# Patient Record
Sex: Male | Born: 1970 | Race: White | Hispanic: No | Marital: Single | Smoking: Never smoker
Health system: Southern US, Community
[De-identification: ages and names within clinical notes are randomized; demographics above are authoritative.]

## PROBLEM LIST (undated history)

## (undated) DIAGNOSIS — E119 Type 2 diabetes mellitus without complications: Secondary | ICD-10-CM

## (undated) HISTORY — PX: KIDNEY TRANSPLANT: SHX239

## (undated) HISTORY — PX: COMBINED KIDNEY-PANCREAS TRANSPLANT: SHX1382

---

## 2018-09-28 ENCOUNTER — Emergency Department (HOSPITAL_BASED_OUTPATIENT_CLINIC_OR_DEPARTMENT_OTHER): Payer: Medicare Other

## 2018-09-28 ENCOUNTER — Emergency Department (HOSPITAL_BASED_OUTPATIENT_CLINIC_OR_DEPARTMENT_OTHER)
Admission: EM | Admit: 2018-09-28 | Discharge: 2018-09-28 | Disposition: A | Discharge status: Home / Self Care | Payer: Medicare Other | Attending: Emergency Medicine | Admitting: Emergency Medicine

## 2018-09-28 ENCOUNTER — Encounter (HOSPITAL_BASED_OUTPATIENT_CLINIC_OR_DEPARTMENT_OTHER): Payer: Self-pay | Admitting: *Deleted

## 2018-09-28 ENCOUNTER — Other Ambulatory Visit: Payer: Self-pay

## 2018-09-28 DIAGNOSIS — Z9483 Pancreas transplant status: Secondary | ICD-10-CM | POA: Insufficient documentation

## 2018-09-28 DIAGNOSIS — E119 Type 2 diabetes mellitus without complications: Secondary | ICD-10-CM | POA: Insufficient documentation

## 2018-09-28 DIAGNOSIS — Z79899 Other long term (current) drug therapy: Secondary | ICD-10-CM | POA: Insufficient documentation

## 2018-09-28 DIAGNOSIS — R2242 Localized swelling, mass and lump, left lower limb: Secondary | ICD-10-CM | POA: Diagnosis present

## 2018-09-28 DIAGNOSIS — Z7982 Long term (current) use of aspirin: Secondary | ICD-10-CM | POA: Insufficient documentation

## 2018-09-28 DIAGNOSIS — L539 Erythematous condition, unspecified: Secondary | ICD-10-CM | POA: Insufficient documentation

## 2018-09-28 DIAGNOSIS — Z89511 Acquired absence of right leg below knee: Secondary | ICD-10-CM | POA: Insufficient documentation

## 2018-09-28 DIAGNOSIS — R69 Illness, unspecified: Secondary | ICD-10-CM

## 2018-09-28 DIAGNOSIS — L03116 Cellulitis of left lower limb: Secondary | ICD-10-CM

## 2018-09-28 DIAGNOSIS — Z940 Kidney transplant status: Secondary | ICD-10-CM | POA: Diagnosis not present

## 2018-09-28 HISTORY — DX: Type 2 diabetes mellitus without complications: E11.9

## 2018-09-28 LAB — CBC WITH DIFFERENTIAL/PLATELET
Abs Immature Granulocytes: 0.02 10*3/uL (ref 0.00–0.07)
Basophils Absolute: 0 10*3/uL (ref 0.0–0.1)
Basophils Relative: 0 %
Eosinophils Absolute: 0.1 10*3/uL (ref 0.0–0.5)
Eosinophils Relative: 1 %
HCT: 46.6 % (ref 39.0–52.0)
Hemoglobin: 15.3 g/dL (ref 13.0–17.0)
Immature Granulocytes: 0 %
Lymphocytes Relative: 21 %
Lymphs Abs: 1.5 10*3/uL (ref 0.7–4.0)
MCH: 32.6 pg (ref 26.0–34.0)
MCHC: 32.8 g/dL (ref 30.0–36.0)
MCV: 99.4 fL (ref 80.0–100.0)
Monocytes Absolute: 1.1 10*3/uL — ABNORMAL HIGH (ref 0.1–1.0)
Monocytes Relative: 15 %
Neutro Abs: 4.6 10*3/uL (ref 1.7–7.7)
Neutrophils Relative %: 63 %
Platelets: 182 10*3/uL (ref 150–400)
RBC: 4.69 MIL/uL (ref 4.22–5.81)
RDW: 12.8 % (ref 11.5–15.5)
WBC: 7.3 10*3/uL (ref 4.0–10.5)
nRBC: 0 % (ref 0.0–0.2)

## 2018-09-28 LAB — COMPREHENSIVE METABOLIC PANEL
ALT: 17 U/L (ref 0–44)
AST: 21 U/L (ref 15–41)
Albumin: 4.1 g/dL (ref 3.5–5.0)
Alkaline Phosphatase: 79 U/L (ref 38–126)
Anion gap: 10 (ref 5–15)
BUN: 10 mg/dL (ref 6–20)
CO2: 24 mmol/L (ref 22–32)
Calcium: 9.3 mg/dL (ref 8.9–10.3)
Chloride: 104 mmol/L (ref 98–111)
Creatinine, Ser: 0.97 mg/dL (ref 0.61–1.24)
GFR calc Af Amer: >60 mL/min (ref >60)
GFR calc non Af Amer: >60 mL/min (ref >60)
Glucose, Bld: 89 mg/dL (ref 70–99)
Potassium: 4.2 mmol/L (ref 3.5–5.1)
Sodium: 138 mmol/L (ref 135–145)
Total Bilirubin: 1.2 mg/dL (ref 0.3–1.2)
Total Protein: 7.5 g/dL (ref 6.5–8.1)

## 2018-09-28 LAB — LACTIC ACID, PLASMA: Lactic Acid, Venous: 1.5 mmol/L (ref 0.5–1.9)

## 2018-09-28 MED ORDER — SODIUM CHLORIDE 0.9 % IV SOLN
1.0000 g | Freq: Once | INTRAVENOUS | Status: AC
  Administered 2018-09-28: 1 g via INTRAVENOUS
  Filled 2018-09-28: qty 10

## 2018-09-28 MED ORDER — CEPHALEXIN 500 MG PO CAPS: 1000.0000 mg | ORAL_CAPSULE | Freq: Two times a day (BID) | ORAL | 0 refills | Status: AC

## 2018-09-28 NOTE — ED Provider Notes (Signed)
[place] EMERGENCY DEPARTMENT Provider Note   CSN: [id number] Arrival date & time: 09/28/18  1844     History   Chief Complaint Chief Complaint  Patient presents with  . Leg Pain    HPI Mark Colon is a 48 y.o. male.     HPI Patient reports he just noticed a little bit of increased redness and swelling to his lower leg yesterday.  He reports there is always a "touch of redness" to the left lower leg.  (He has a amputation of the right lower extremity.  Distant from a complication of foot fracture).  Patient wears work boots.  He reports that today he noticed that the leg was actually sore and it was much more red.  He sent a picture to some of his friends were medics and they advised him to get it checked at the emergency department.  He reports he has no other symptoms.  No fevers no chills no malaise.  No chest pain no shortness of breath.  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  He has done well with this for years. Past Medical History:  Diagnosis Date  . Diabetes mellitus without complication ([place])     There are no active problems to display for this patient.   Past Surgical History:  Procedure Laterality Date  . COMBINED KIDNEY-PANCREAS TRANSPLANT    . KIDNEY TRANSPLANT          Home Medications    Prior to Admission medications   Medication Sig Start Date End Date Taking? Authorizing Provider  aspirin EC 81 MG tablet Take by mouth.   Yes [provider]  metoprolol tartrate (LOPRESSOR) 25 MG tablet metoprolol tartrate 25 mg tablet  Take 1 tablet twice a day by oral route. 11/15/11  Yes [provider]  mycophenolate (CELLCEPT) 250 MG capsule CellCept 250 mg capsule  Take 6 capsules twice a day by oral route. 04/13/18  Yes [provider]  ondansetron (ZOFRAN) 8 MG tablet Zofran 8 mg tablet  Take 1/2 - 1 tablet(s) EVERY 8 HOURS by oral route as needed   Yes [provider]   Oxycodone HCl 10 MG TABS oxycodone 10 mg tablet  Take 1/2 - 1 tablet(s) EVERY 4 HOURS by oral route as needed   Yes [provider]  promethazine (PHENERGAN) 25 MG tablet promethazine 25 mg tablet  Take 1 tablet every 8 hours by oral route as needed.   Yes [provider]  tacrolimus (PROGRAF) 0.5 MG capsule TAKE 1 CAPSULE BY MOUTH TWO TIMES DAILY 08/09/18  Yes [provider]  tacrolimus (PROGRAF) 1 MG capsule Prograf 1 mg capsule  Take 2 mg twice a day by oral route. 08/09/18  Yes [provider]  triamcinolone acetonide (TRIESENCE) 40 MG/ML SUSP triamcinolone acetonide 40 mg/mL suspension for injection  Administer 60mg  once   Yes [provider]  cephALEXin (KEFLEX) 500 MG capsule Take 2 capsules (1,000 mg total) by mouth 2 (two) times daily. 09/28/18   Charlesetta Shanks, MD    Family History No family history on file.  Social History Social History   Tobacco Use  . Smoking status: Never Smoker  . Smokeless tobacco: Never Used  Substance Use Topics  . Alcohol use: Never    Frequency: Never  . Drug use: Never     Allergies   Codeine and Metoclopramide   Review of Systems Review of Systems 10 Systems reviewed and are negative for acute change  except as noted in the HPI.   Physical Exam Updated Vital Signs BP 129/88 (BP Location: Right Arm)   Pulse 72   Temp 98.7 F (37.1 C) (Oral)   Resp 18   Ht 5\' 7"  (1.702 m)   Wt 97.7 kg   SpO2 99%   BMI 33.73 kg/m   Physical Exam Constitutional:      Comments: Patient is alert and appropriate.  Nontoxic.  No distress.  Cardiovascular:     Rate and Rhythm: Normal rate and regular rhythm.  Pulmonary:     Effort: Pulmonary effort is normal.     Breath sounds: Normal breath sounds.  Abdominal:     General: There is no distension.     Palpations: Abdomen is soft.     Tenderness: There is no abdominal tenderness. There is no guarding.  Musculoskeletal:     Comments: Left lower  extremity has erythema that is circumferential from above the ankle to about the midpoint of the lower leg.  About 1+ edema.  The foot and ankle are spared of erythema.  No streaking.  Her Salas pedis pulses 2+ and strong.  The foot is warm and dry.  There are no wounds on the foot or between the toes.  No effusion of the knee.  Patient is wearing a prosthesis on his right lower extremity.  Skin:    General: Skin is warm and dry.  Neurological:     General: No focal deficit present.     Mental Status: He is oriented to person, place, and time.     Coordination: Coordination normal.  Psychiatric:        Mood and Affect: Mood normal.      ED Treatments / Results  Labs (all labs ordered are listed, but only abnormal results are displayed) Labs Reviewed  CBC WITH DIFFERENTIAL/PLATELET - Abnormal; Notable for the following components:      Result Value   Monocytes Absolute 1.1 (*)    All other components within normal limits  CULTURE, BLOOD (ROUTINE X 2)  CULTURE, BLOOD (ROUTINE X 2)  COMPREHENSIVE METABOLIC PANEL  LACTIC ACID, PLASMA  LACTIC ACID, PLASMA    EKG None  Radiology [place] Venous Img Lower Unilateral Left  Result Date: 09/28/2018 CLINICAL DATA:  48 year old male with left lower extremity redness and swelling and pain x2 days. EXAM: Left LOWER EXTREMITY VENOUS DOPPLER ULTRASOUND TECHNIQUE: Gray-scale sonography with graded compression, as well as color Doppler and duplex ultrasound were performed to evaluate the lower extremity deep venous systems from the level of the common femoral vein and including the common femoral, femoral, profunda femoral, popliteal and calf veins including the posterior tibial, peroneal and gastrocnemius veins when visible. The superficial great saphenous vein was also interrogated. Spectral Doppler was utilized to evaluate flow at rest and with distal augmentation maneuvers in the common femoral, femoral and popliteal veins. COMPARISON:  None. FINDINGS:  Contralateral Common Femoral Vein: Respiratory phasicity is normal and symmetric with the symptomatic side. No evidence of thrombus. Normal compressibility. Common Femoral Vein: No evidence of thrombus. Normal compressibility, respiratory phasicity and response to augmentation. Saphenofemoral Junction: No evidence of thrombus. Normal compressibility and flow on color Doppler imaging. Profunda Femoral Vein: No evidence of thrombus. Normal compressibility and flow on color Doppler imaging. 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 No evidence of thrombus. Normal compressibility, respiratory phasicity and response to augmentation. Popliteal Vein: No evidence of thrombus. Normal compressibility, respiratory phasicity and response to augmentation. Calf Veins:  The calf veins are suboptimally visualized due to overlying soft tissue edema. The visualized portions of the posterior tibial and peroneal veins are patent. Superficial Great Saphenous Vein: No evidence of thrombus. Normal compressibility. Venous Reflux:  There is subcutaneous edema at the ankle. Other Findings:  None. IMPRESSION: No evidence of deep venous thrombosis. Electronically Signed   By: Elgie CollardArash  Radparvar M.D.   On: 09/28/2018 22:31    Procedures Procedures (including critical care time)  Medications Ordered in ED Medications  cefTRIAXone (ROCEPHIN) 1 g in sodium chloride 0.9 % 100 mL IVPB (0 g Intravenous Stopped 09/28/18 2104)     Initial Impression / Assessment and Plan / ED Course  I have reviewed the triage vital signs and the nursing notes.  Pertinent labs & imaging results that were available during my care of the patient were reviewed by me and considered in my medical decision making (see chart for details).       Patient is clinically well.  He however has significant comorbid illness with immunosuppression for pancreas renal transplant history.  He has erythema around the lower leg.  No streaking and no  involvement of the foot or the ankle.  After treatment antibiotics patient reported feeling much improved.  At this time with no leukocytosis, normal vital signs and no lactic acidosis will opt for initiating outpatient treatment.  Patient has been given 1 g of Rocephin in the emergency department.  Careful return precautions have been reviewed and the patient voices understanding.  Final Clinical Impressions(s) / ED Diagnoses   Final diagnoses:  Cellulitis of left lower extremity  Severe comorbid illness    ED Discharge Orders         Ordered    cephALEXin (KEFLEX) 500 MG capsule  2 times daily     09/28/18 2334           Arby BarrettePfeiffer, Almadelia Looman, MD 09/28/18 2341

## 2018-09-28 NOTE — ED Notes (Signed)
Patient transported to Ultrasound 

## 2018-09-28 NOTE — Discharge Instructions (Signed)
1.  Start your antibiotics tomorrow. 2.  Elevate your leg as much as possible for the next 2 days. 3.  Return to emergency department immediately if you develop a fever generally do not feel well or redness is advancing. 4.  See your doctor for recheck at the beginning of the week.

## 2018-09-28 NOTE — ED Notes (Signed)
Unable to obtain 2nd blood culture due to difficult IV stick. 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 EDP informed.

## 2018-09-28 NOTE — ED Triage Notes (Signed)
Left lower leg is red, swollen, hot to touch. He noticed the symptoms yesterday. Hx of diabetes.

## 2018-10-03 LAB — CULTURE, BLOOD (ROUTINE X 2)
Culture: NO GROWTH
Special Requests: ADEQUATE

## 2021-02-25 IMAGING — US VENOUS DOPPLER ULTRASOUND OF LEFT LOWER EXTREMITY
1 series · 13 of 24 positions shown · non-contrast
Comparison: None.

CLINICAL DATA: 47-year-old male with left lower extremity redness
and swelling and pain x2 days.



[Series 1: venous doppler ultrasound of left lower extremity · 13 of 42 slices shown]
[im 1/42]
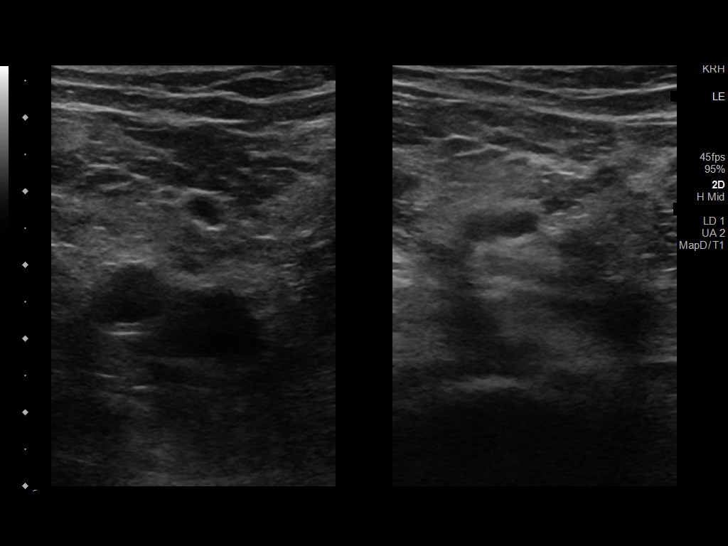
[im 4/42]
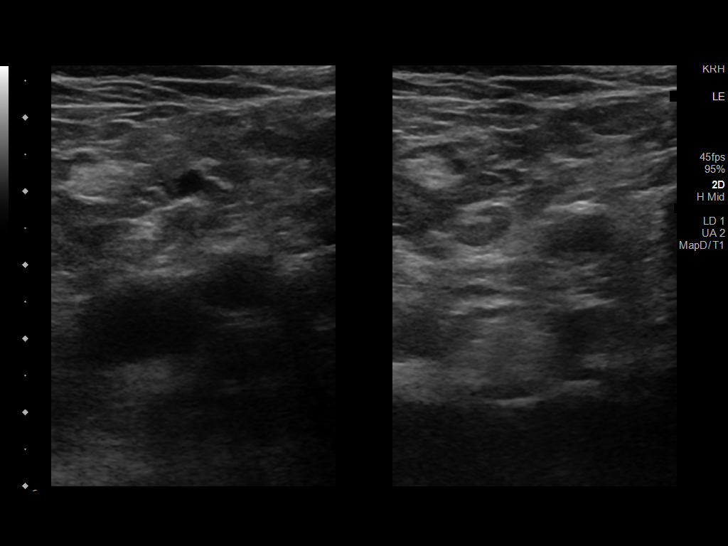
[im 8/42]
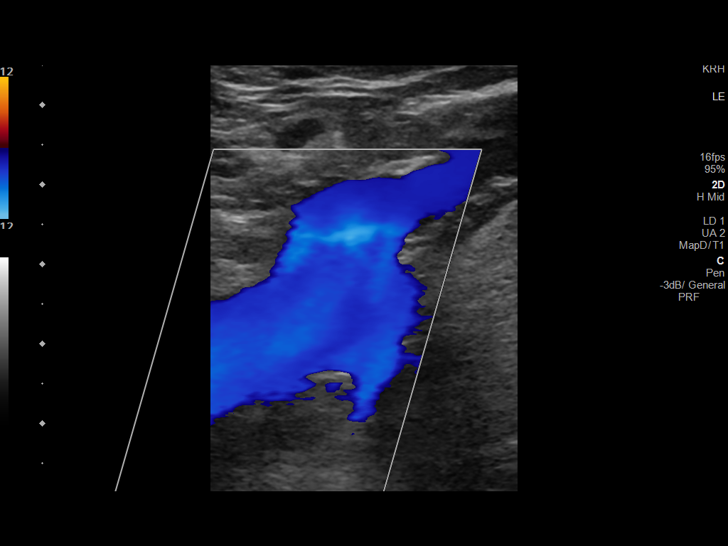
[im 11/42]
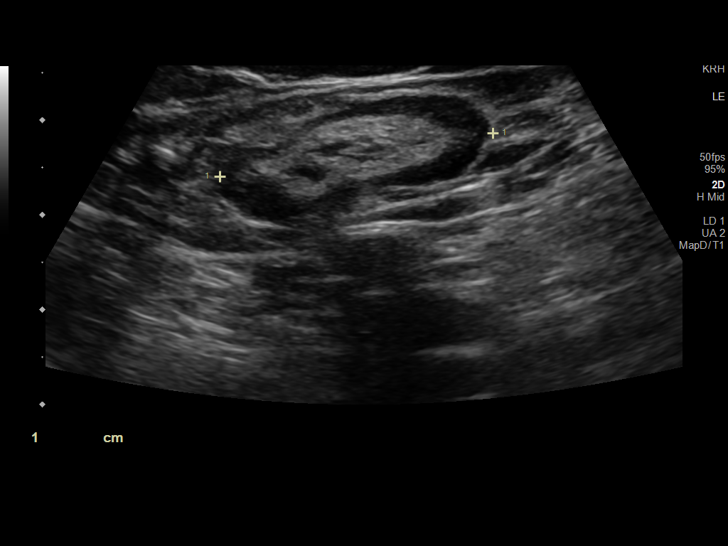
[im 15/42]
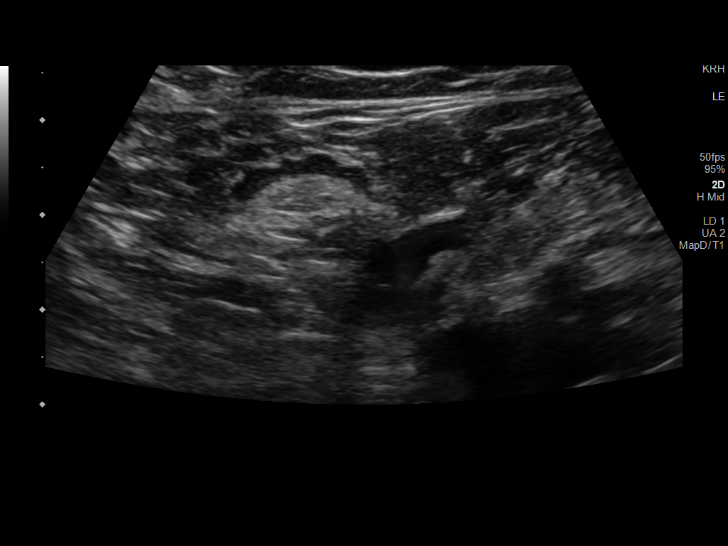
[im 18/42]
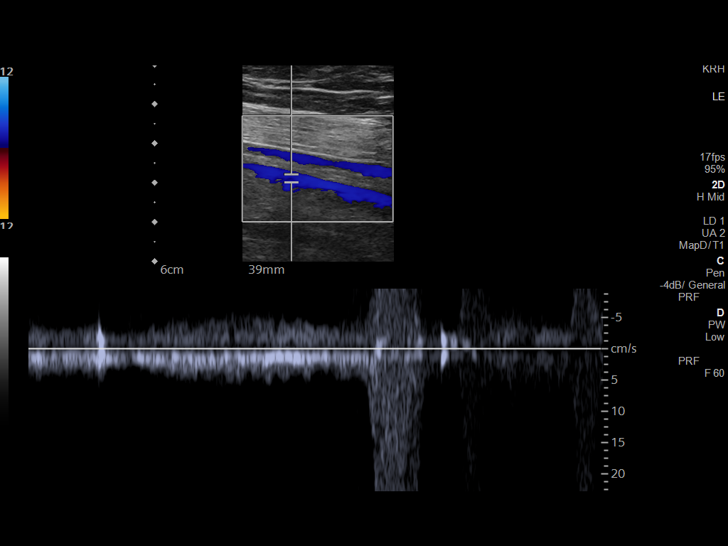
[im 22/42]
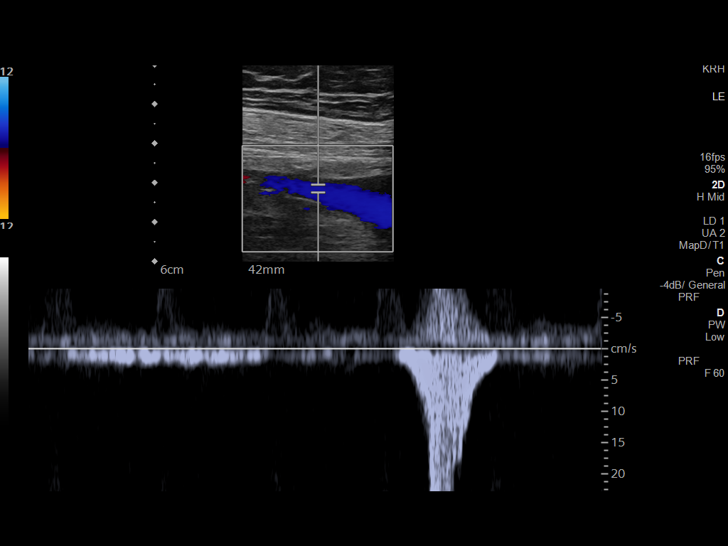
[im 24/42]
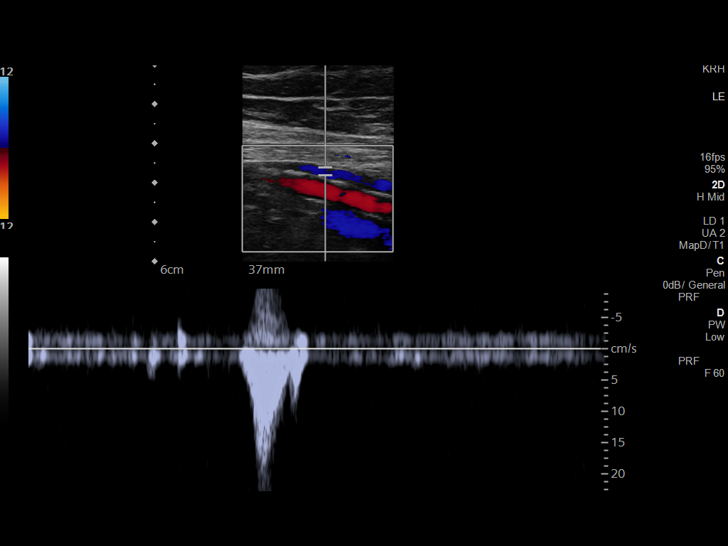
[im 27/42]
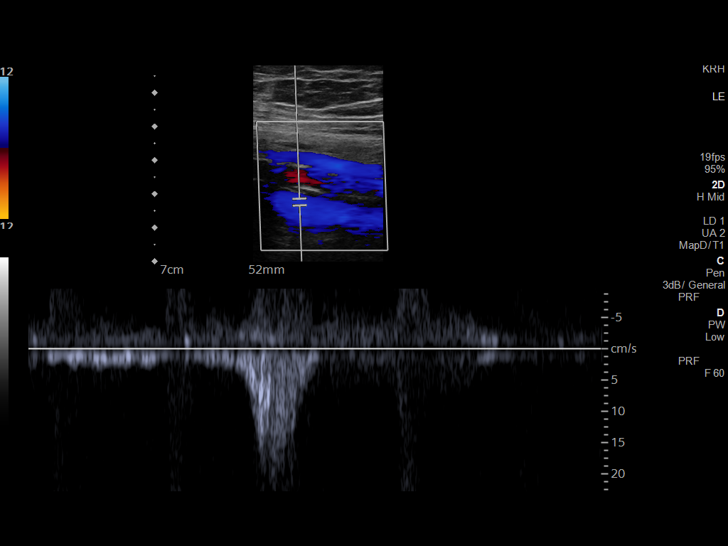
[im 31/42]
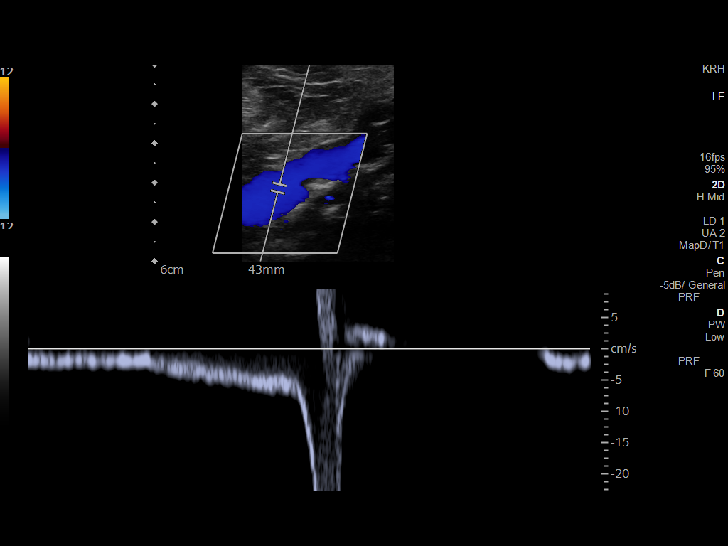
[im 34/42]
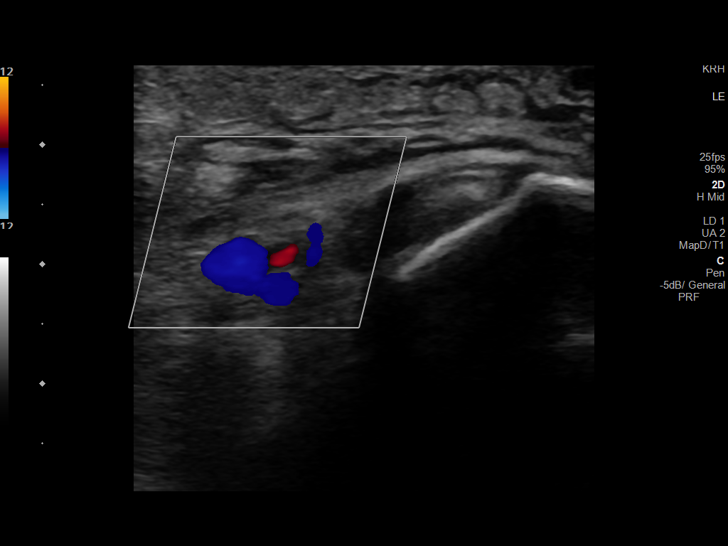
[im 38/42]
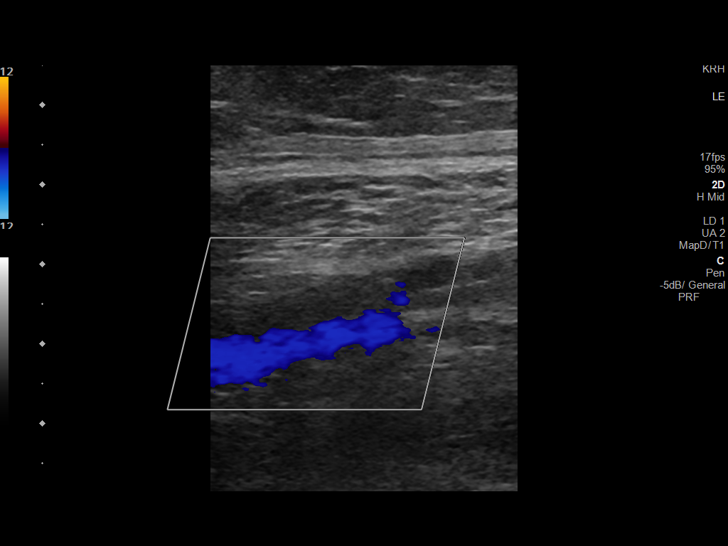
[im 42/42]
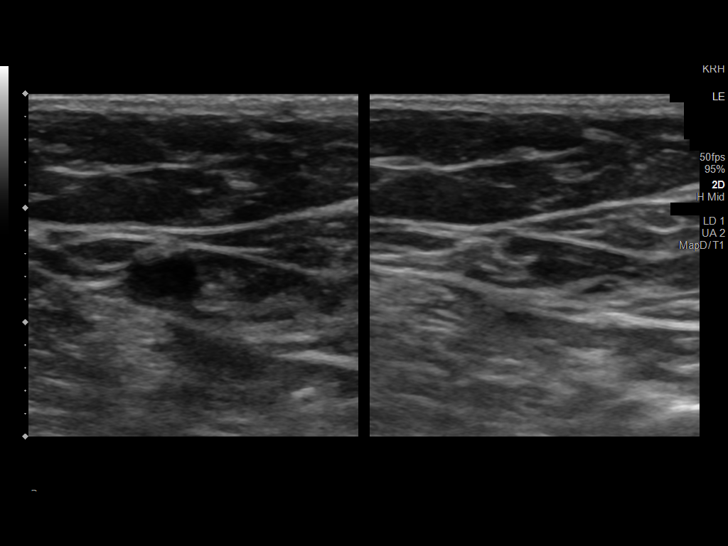

[13 of 24 positions shown; findings below may reference images not displayed]

FINDINGS: Contralateral Common Femoral Vein: Respiratory phasicity is normal
and symmetric with the symptomatic side. No evidence of thrombus.
Normal compressibility.

Common Femoral Vein: No evidence of thrombus. Normal
compressibility, respiratory phasicity and response to augmentation.

Saphenofemoral Junction: No evidence of thrombus. Normal
compressibility and flow on color Doppler imaging.

Profunda Femoral Vein: No evidence of thrombus. Normal
compressibility and flow on color Doppler imaging.

Femoral Vein: There is duplication of the femoral vein. No evidence
of thrombus. Normal compressibility, respiratory phasicity and
response to augmentation.

Popliteal Vein: No evidence of thrombus. Normal compressibility,
respiratory phasicity and response to augmentation.

Calf Veins: The calf veins are suboptimally visualized due to
overlying soft tissue edema. The visualized portions of the
posterior tibial and peroneal veins are patent.

Superficial Great Saphenous Vein: No evidence of thrombus. Normal
compressibility.

Venous Reflux:  There is subcutaneous edema at the ankle.

Other Findings:  None.
IMPRESSION: No evidence of deep venous thrombosis.

## 2023-04-08 DEATH — deceased
